# Patient Record
Sex: Female | Born: 1945 | Race: White | Hispanic: No | Marital: Married | Smoking: Never smoker
Health system: Southern US, Community
[De-identification: ages and names within clinical notes are randomized; demographics above are authoritative.]

## PROBLEM LIST (undated history)

## (undated) DIAGNOSIS — R011 Cardiac murmur, unspecified: Secondary | ICD-10-CM

## (undated) DIAGNOSIS — K219 Gastro-esophageal reflux disease without esophagitis: Secondary | ICD-10-CM

## (undated) DIAGNOSIS — Z8719 Personal history of other diseases of the digestive system: Secondary | ICD-10-CM

## (undated) DIAGNOSIS — E7800 Pure hypercholesterolemia, unspecified: Secondary | ICD-10-CM

## (undated) DIAGNOSIS — I10 Essential (primary) hypertension: Secondary | ICD-10-CM

## (undated) DIAGNOSIS — K589 Irritable bowel syndrome without diarrhea: Secondary | ICD-10-CM

## (undated) DIAGNOSIS — M1990 Unspecified osteoarthritis, unspecified site: Secondary | ICD-10-CM

## (undated) DIAGNOSIS — J45909 Unspecified asthma, uncomplicated: Secondary | ICD-10-CM

## (undated) DIAGNOSIS — H269 Unspecified cataract: Secondary | ICD-10-CM

## (undated) HISTORY — PX: DILATION AND CURETTAGE OF UTERUS: SHX78

## (undated) HISTORY — PX: COLONOSCOPY W/ BIOPSIES AND POLYPECTOMY: SHX1376

---
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
## 1984-02-25 HISTORY — PX: TUBAL LIGATION: SHX77

## 2003-02-25 HISTORY — PX: OTHER SURGICAL HISTORY: SHX169

## 2009-02-24 HISTORY — PX: REPLACEMENT TOTAL KNEE: SUR1224

## 2009-03-05 ENCOUNTER — Inpatient Hospital Stay (HOSPITAL_COMMUNITY): Admission: RE | Admit: 2009-03-05 | Discharge: 2009-03-07 | Payer: Self-pay | Admitting: Orthopedic Surgery

## 2010-05-12 LAB — CBC
HCT: 26.7 % — ABNORMAL LOW (ref 36.0–46.0)
HCT: 38 % (ref 36.0–46.0)
Hemoglobin: 10.2 g/dL — ABNORMAL LOW (ref 12.0–15.0)
Hemoglobin: 9.4 g/dL — ABNORMAL LOW (ref 12.0–15.0)
MCHC: 34.5 g/dL (ref 30.0–36.0)
MCHC: 34.8 g/dL (ref 30.0–36.0)
MCV: 89.8 fL (ref 78.0–100.0)
MCV: 90.2 fL (ref 78.0–100.0)
Platelets: 164 10*3/uL (ref 150–400)
Platelets: 177 10*3/uL (ref 150–400)
Platelets: 218 10*3/uL (ref 150–400)
RBC: 2.96 MIL/uL — ABNORMAL LOW (ref 3.87–5.11)
RBC: 3.27 MIL/uL — ABNORMAL LOW (ref 3.87–5.11)
RDW: 13 % (ref 11.5–15.5)

## 2010-05-12 LAB — COMPREHENSIVE METABOLIC PANEL
ALT: 17 U/L (ref 0–35)
Alkaline Phosphatase: 64 U/L (ref 39–117)
GFR calc Af Amer: >60 mL/min (ref >60)
GFR calc non Af Amer: >60 mL/min (ref >60)
Potassium: 4.4 mEq/L (ref 3.5–5.1)
Sodium: 140 mEq/L (ref 135–145)
Total Bilirubin: 1.1 mg/dL (ref 0.3–1.2)

## 2010-05-12 LAB — BASIC METABOLIC PANEL
BUN: 5 mg/dL — ABNORMAL LOW (ref 6–23)
Calcium: 8.4 mg/dL (ref 8.4–10.5)
Calcium: 8.6 mg/dL (ref 8.4–10.5)
Chloride: 99 mEq/L (ref 96–112)
Creatinine, Ser: 0.57 mg/dL (ref 0.4–1.2)
Creatinine, Ser: 0.57 mg/dL (ref 0.4–1.2)
GFR calc Af Amer: >60 mL/min (ref >60)
GFR calc non Af Amer: >60 mL/min (ref >60)
Potassium: 3.6 mEq/L (ref 3.5–5.1)

## 2010-05-12 LAB — URINE CULTURE

## 2010-05-12 LAB — DIFFERENTIAL
Basophils Absolute: 0 10*3/uL (ref 0.0–0.1)
Eosinophils Absolute: 0.1 10*3/uL (ref 0.0–0.7)
Lymphocytes Relative: 23 % (ref 12–46)
Lymphs Abs: 2.2 10*3/uL (ref 0.7–4.0)
Neutrophils Relative %: 67 % (ref 43–77)

## 2010-05-12 LAB — URINALYSIS, ROUTINE W REFLEX MICROSCOPIC
Protein, ur: NEGATIVE mg/dL
Specific Gravity, Urine: 1.013 (ref 1.005–1.030)
pH: 6 (ref 5.0–8.0)

## 2010-05-12 LAB — PROTIME-INR: INR: 0.98 (ref 0.00–1.49)

## 2010-05-12 LAB — APTT: aPTT: 28 seconds (ref 24–37)

## 2010-05-12 LAB — CROSSMATCH

## 2010-06-09 IMAGING — CR DG CHEST 2V
2 series · 2 of 2 positions shown · non-contrast
Comparison: None available.

CLINICAL DATA: Preadmission respiratory film for patient with knee
osteoarthritis.

CHEST - 2 VIEW

[view not recorded (1 of 2)]
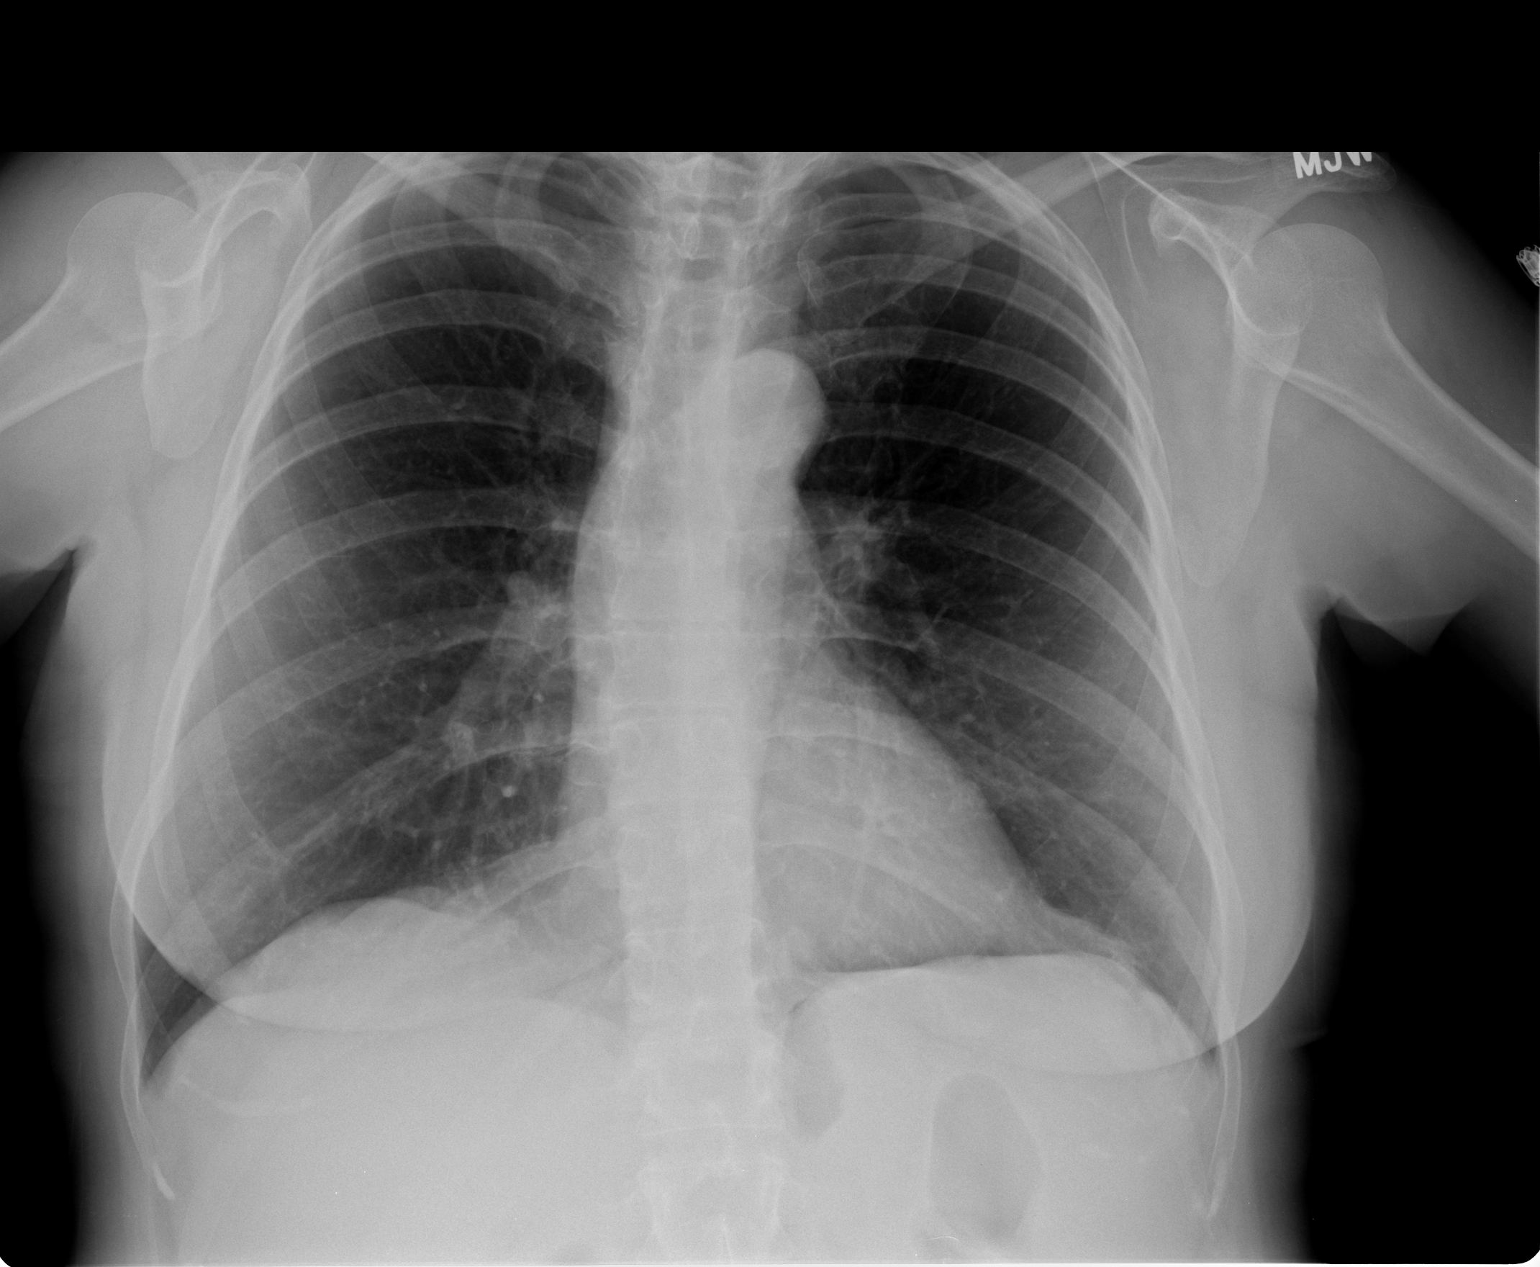

[view not recorded (2 of 2)]
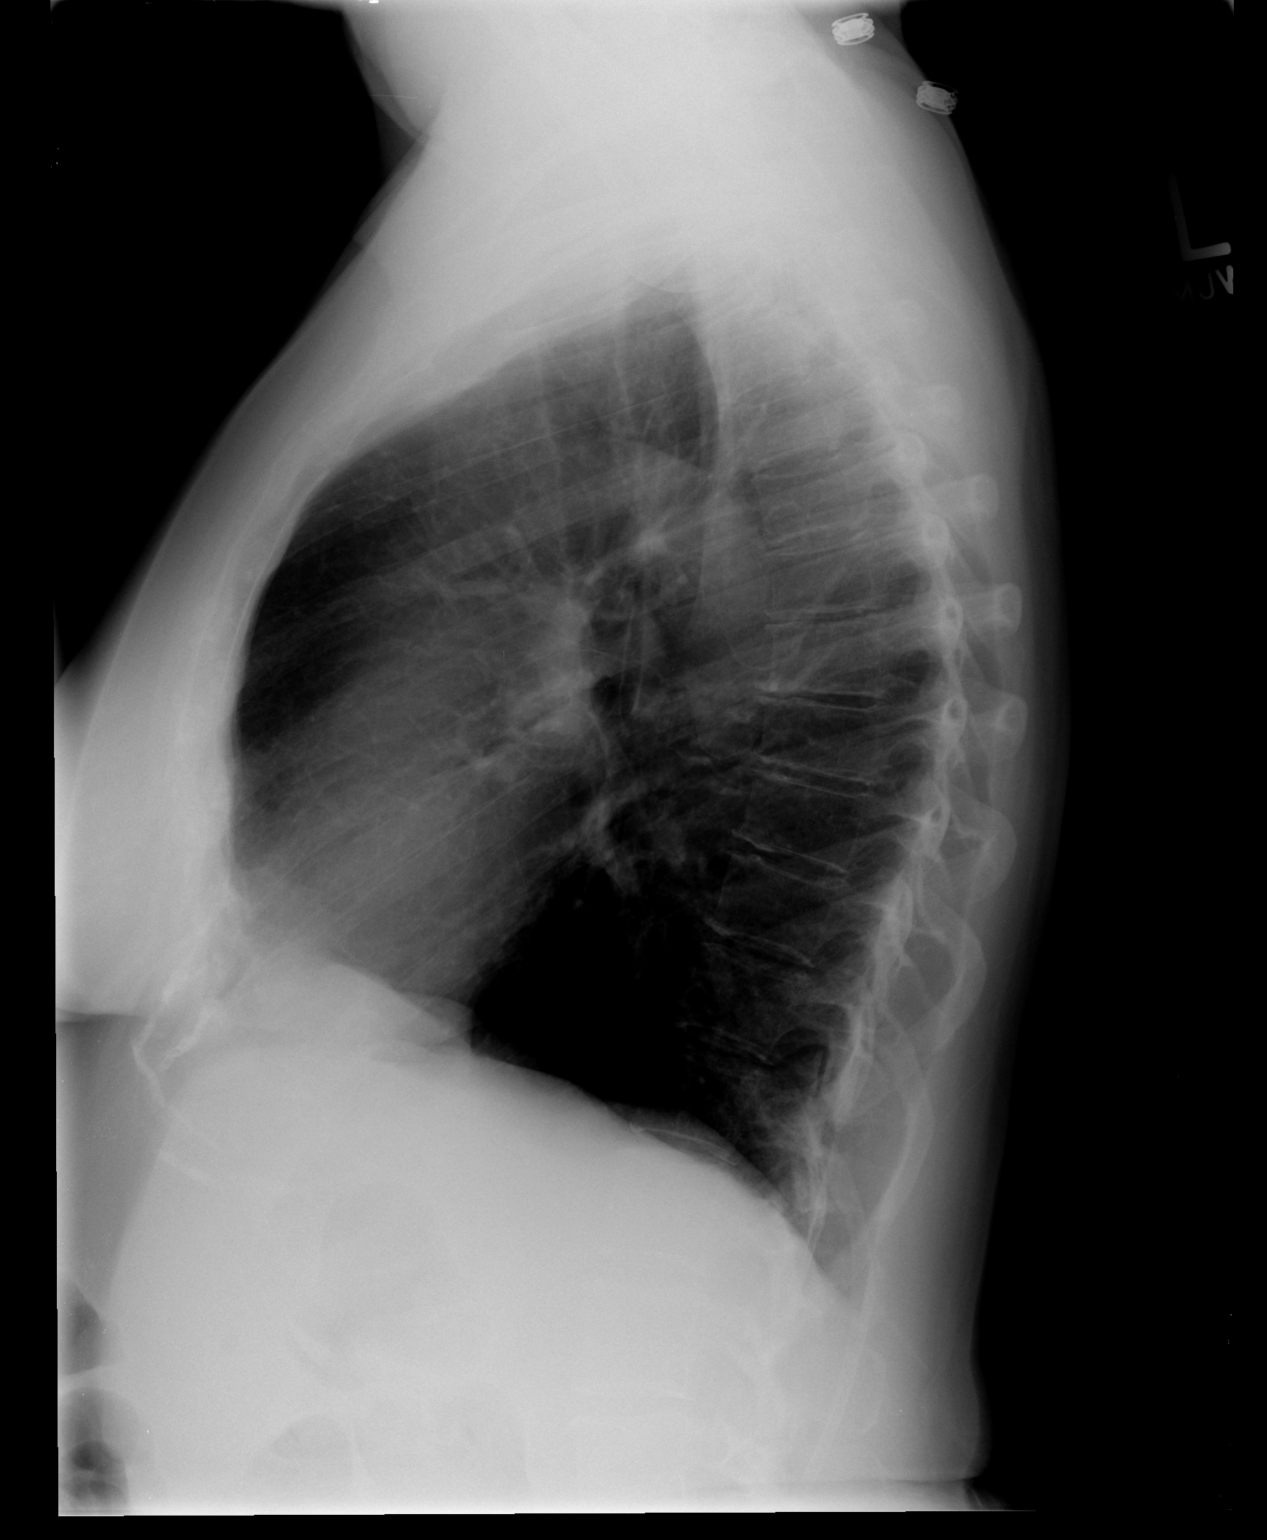

[2 of 2 positions shown; findings below may reference images not displayed]

FINDINGS: The lungs are clear.  Heart size is normal.  There is no
pleural effusion or focal bony abnormality.
IMPRESSION: No acute disease.

## 2013-08-30 ENCOUNTER — Ambulatory Visit (INDEPENDENT_AMBULATORY_CARE_PROVIDER_SITE_OTHER): Payer: Medicare Other | Admitting: General Surgery

## 2013-08-30 ENCOUNTER — Encounter (INDEPENDENT_AMBULATORY_CARE_PROVIDER_SITE_OTHER): Payer: Self-pay | Admitting: General Surgery

## 2013-08-30 VITALS — BP 122/85 | HR 95 | Temp 98.3°F | Resp 14 | Ht 63.0 in | Wt 168.6 lb

## 2013-08-30 DIAGNOSIS — N6091 Unspecified benign mammary dysplasia of right breast: Secondary | ICD-10-CM

## 2013-08-30 DIAGNOSIS — N62 Hypertrophy of breast: Secondary | ICD-10-CM

## 2013-08-30 NOTE — Patient Instructions (Signed)
[facility] °Office Phone Number [phone] ° °BREAST BIOPSY/ PARTIAL MASTECTOMY: POST OP INSTRUCTIONS ° °Always review your discharge instruction sheet given to you by the facility where your surgery was performed. ° °IF YOU HAVE DISABILITY OR FAMILY LEAVE FORMS, YOU MUST BRING THEM TO THE OFFICE FOR PROCESSING.  DO NOT GIVE THEM TO YOUR DOCTOR. ° °1. A prescription for pain medication may be given to you upon discharge.  Take your pain medication as prescribed, if needed.  If narcotic pain medicine is not needed, then you may take acetaminophen (Tylenol) or ibuprofen (Advil) as needed. °2. Take your usually prescribed medications unless otherwise directed °3. If you need a refill on your pain medication, please contact your pharmacy.  They will contact our office to request authorization.  Prescriptions will not be filled after 5pm or on week-ends. °4. You should eat very light the first 24 hours after surgery, such as soup, crackers, pudding, etc.  Resume your normal diet the day after surgery. °5. Most patients will experience some swelling and bruising in the breast.  Ice packs and a good support bra will help.  Swelling and bruising can take several days to resolve.  °6. It is common to experience some constipation if taking pain medication after surgery.  Increasing fluid intake and taking a stool softener will usually help or prevent this problem from occurring.  A mild laxative (Milk of Magnesia or Miralax) should be taken according to package directions if there are no bowel movements after 48 hours. °7. Unless discharge instructions indicate otherwise, you may remove your bandages 24-48 hours after surgery, and you may shower at that time.  You may have steri-strips (small skin tapes) in place directly over the incision.  These strips should be left on the skin for 7-10 days.  If your surgeon used skin glue on the incision, you may shower in 24 hours.  The glue will flake off over the  next 2-3 weeks.  Any sutures or staples will be removed at the office during your follow-up visit. °8. ACTIVITIES:  You may resume regular daily activities (gradually increasing) beginning the next day.  Wearing a good support bra or sports bra minimizes pain and swelling.  You may have sexual intercourse when it is comfortable. °a. You may drive when you no longer are taking prescription pain medication, you can comfortably wear a seatbelt, and you can safely maneuver your car and apply brakes. °b. RETURN TO WORK:  ______________________________________________________________________________________ °9. You should see your doctor in the office for a follow-up appointment approximately two weeks after your surgery.  Your doctor’s nurse will typically make your follow-up appointment when she calls you with your pathology report.  Expect your pathology report 2-3 business days after your surgery.  You may call to check if you do not hear from us after three days. °10. OTHER INSTRUCTIONS: _______________________________________________________________________________________________ _____________________________________________________________________________________________________________________________________ °_____________________________________________________________________________________________________________________________________ °_____________________________________________________________________________________________________________________________________ ° °WHEN TO CALL YOUR DOCTOR: °1. Fever over 101.0 °2. Nausea and/or vomiting. °3. Extreme swelling or bruising. °4. Continued bleeding from incision. °5. Increased pain, redness, or drainage from the incision. ° °The clinic staff is available to answer your questions during regular business hours.  Please don’t hesitate to call and ask to speak to one of the nurses for clinical concerns.  If you have a medical emergency, go to the nearest  emergency room or call 911.  A surgeon from [facility] is always on call at the hospital. ° °For further questions, please visit [email or web address]  °

## 2013-08-30 NOTE — Progress Notes (Signed)
Patient ID: Shannon Palmer, female   DOB: 10/02/1945, 68 y.o.   MRN: [id number]  No chief complaint on file.   HPI Shannon Palmer is a 68 y.o. female.   HPI  She is referred by Dr. Yolanda BonineBertrand because of atypical lobular hyperplasia of the right breast found on a biopsy of a area of microcalcifications in the right breast. She underwent a screening mammogram and was noted to have an area of microcalcifications at the 12:00 position of the right breast that was small. These were suspicious however. Image guided biopsy was performed.  The result is as above. She had a paternal aunt with breast cancer. Age at menarche was [age]. Menopause was in her 2840s. She's not had any children. No palpable masses. No nipple discharge. No female hormone replacement therapy.  HTN, Hyperlipidemia  Past Surgical History  Procedure Laterality Date  . Tonsillectomy  1949  . Wisdom tooth extraction  1964  . Tubal ligation  1986  . Replacement total knee Right 2011  . Knee arthroscopy Right 2005    Family History  Problem Relation Age of Onset  . Cancer Mother     leukemia  . Stroke Mother   . Cancer Father     colon  . Pneumonia Father     Social History History  Substance Use Topics  . Smoking status: Never Smoker   . Smokeless tobacco: Not on file  . Alcohol Use: 0.6 - 1.2 oz/week    1-2 Glasses of wine per week     Comment: 1-2 glasses daily    Allergies  Allergen Reactions  . Calencula Officinalis ([place]end[place]) Hives  . Ciprofloxacin Hives    Current Outpatient Prescriptions  Medication Sig Dispense Refill  . albuterol (PROVENTIL HFA;VENTOLIN HFA) 108 (90 BASE) MCG/ACT inhaler Inhale 1-2 puffs into the lungs every 6 (six) hours as needed for wheezing or shortness of breath.      Marland Kitchen. aspirin 81 MG tablet Take 81 mg by mouth daily.      . beta carotene w/minerals (OCUVITE) tablet Take 1 tablet by mouth daily.      . Cholecalciferol (VITAMIN D) 2000 UNITS tablet Take 2,000 Units by mouth daily.      Marland Kitchen.  co-enzyme Q-10 30 MG capsule Take 30 mg by mouth 3 (three) times daily.      . CYCLOBENZAPRINE HCL PO Take 10 mg by mouth 3 (three) times daily.      Marland Kitchen. ibuprofen (ADVIL,MOTRIN) 800 MG tablet Take 800 mg by mouth every 8 (eight) hours as needed.      Marland Kitchen. lisinopril-hydrochlorothiazide (PRINZIDE,ZESTORETIC) 20-12.5 MG per tablet Take 1 tablet by mouth daily.      . meclizine (ANTIVERT) 25 MG tablet Take 25 mg by mouth 3 (three) times daily as needed for dizziness.      . Omega-3 Fatty Acids (FISH OIL PO) Take by mouth daily.      . rosuvastatin (CRESTOR) 20 MG tablet Take 20 mg by mouth daily.       No current facility-administered medications for this visit.    Review of Systems Review of Systems  HENT: Negative.   Respiratory: Negative.   Cardiovascular: Negative.   Gastrointestinal: Negative.   Genitourinary: Negative.   Neurological: Negative.   Hematological: Negative.     Blood pressure 122/85, pulse 95, temperature 98.3 F (36.8 C), temperature source Temporal, resp. rate 14, height 5\' 3"  (1.6 m), weight 168 lb 9.6 oz (76.476 kg).  Physical Exam Physical Exam  Constitutional: She appears well-developed and well-nourished.  HENT:  Head: Normocephalic and atraumatic.  Eyes: No scleral icterus.  Neck: Neck supple.  Cardiovascular: Normal rate and regular rhythm.   Pulmonary/Chest: Effort normal and breath sounds normal.  Right breast demonstrates a puncture wound and some ecchymosis superiorly. No palpable mass. Left breast demonstrates no dominant masses or suspicious skin changes. No supraclavicular adenopathy.  Musculoskeletal:  No axillary adenopathy.  Lymphadenopathy:    She has no cervical adenopathy.  Neurological: She is alert.  Skin: Skin is warm and dry.  Psychiatric: She has a normal mood and affect. Her behavior is normal.    Data Reviewed Mammogram. Pathology report.  Assessment    Atypical lobular hyperplasia right breast which is a potentially  premalignant lesion. Wide excision of the area is recommended and I discussed this with her.     Plan    Right breast lumpectomy after wire localization.  I have explained the procedure, risks, and aftercare to her.  Risks include but are not limited to bleeding, infection, wound problems, deformity, anesthesia.  She seems to understand and agrees with the plan.        Charleen Madera J 08/30/2013, 5:33 PM    

## 2013-09-06 ENCOUNTER — Other Ambulatory Visit (INDEPENDENT_AMBULATORY_CARE_PROVIDER_SITE_OTHER): Payer: Self-pay | Admitting: General Surgery

## 2013-09-06 DIAGNOSIS — D0501 Lobular carcinoma in situ of right breast: Secondary | ICD-10-CM

## 2013-09-07 ENCOUNTER — Encounter (HOSPITAL_COMMUNITY): Payer: Self-pay | Admitting: Pharmacy Technician

## 2013-09-12 ENCOUNTER — Encounter (HOSPITAL_COMMUNITY): Payer: Self-pay

## 2013-09-12 ENCOUNTER — Ambulatory Visit (HOSPITAL_COMMUNITY)
Admission: RE | Admit: 2013-09-12 | Discharge: 2013-09-12 | Disposition: A | Discharge status: Home / Self Care | Payer: Medicare Other | Source: Ambulatory Visit | Attending: General Surgery | Admitting: General Surgery

## 2013-09-12 ENCOUNTER — Ambulatory Visit (HOSPITAL_COMMUNITY)
Admission: RE | Admit: 2013-09-12 | Discharge: 2013-09-12 | Disposition: A | Discharge status: Home / Self Care | Payer: Medicare Other | Source: Ambulatory Visit | Attending: Anesthesiology | Admitting: Anesthesiology

## 2013-09-12 DIAGNOSIS — E785 Hyperlipidemia, unspecified: Secondary | ICD-10-CM | POA: Diagnosis not present

## 2013-09-12 DIAGNOSIS — Z96659 Presence of unspecified artificial knee joint: Secondary | ICD-10-CM | POA: Diagnosis not present

## 2013-09-12 DIAGNOSIS — N6019 Diffuse cystic mastopathy of unspecified breast: Secondary | ICD-10-CM | POA: Diagnosis present

## 2013-09-12 DIAGNOSIS — Z79899 Other long term (current) drug therapy: Secondary | ICD-10-CM | POA: Diagnosis not present

## 2013-09-12 DIAGNOSIS — Z7982 Long term (current) use of aspirin: Secondary | ICD-10-CM | POA: Diagnosis not present

## 2013-09-12 DIAGNOSIS — I10 Essential (primary) hypertension: Secondary | ICD-10-CM | POA: Diagnosis not present

## 2013-09-12 DIAGNOSIS — K449 Diaphragmatic hernia without obstruction or gangrene: Secondary | ICD-10-CM | POA: Diagnosis not present

## 2013-09-12 DIAGNOSIS — K219 Gastro-esophageal reflux disease without esophagitis: Secondary | ICD-10-CM | POA: Diagnosis not present

## 2013-09-12 DIAGNOSIS — J45909 Unspecified asthma, uncomplicated: Secondary | ICD-10-CM | POA: Diagnosis not present

## 2013-09-12 HISTORY — DX: Irritable bowel syndrome, unspecified: K58.9

## 2013-09-12 HISTORY — DX: Cardiac murmur, unspecified: R01.1

## 2013-09-12 HISTORY — DX: Pure hypercholesterolemia, unspecified: E78.00

## 2013-09-12 HISTORY — DX: Unspecified cataract: H26.9

## 2013-09-12 HISTORY — DX: Gastro-esophageal reflux disease without esophagitis: K21.9

## 2013-09-12 HISTORY — DX: Unspecified asthma, uncomplicated: J45.909

## 2013-09-12 HISTORY — DX: Essential (primary) hypertension: I10

## 2013-09-12 HISTORY — DX: Personal history of other diseases of the digestive system: Z87.19

## 2013-09-12 HISTORY — DX: Unspecified osteoarthritis, unspecified site: M19.90

## 2013-09-12 LAB — CBC WITH DIFFERENTIAL/PLATELET
Basophils Absolute: 0 10*3/uL (ref 0.0–0.1)
Basophils Relative: 0 % (ref 0–1)
EOS ABS: 0.1 10*3/uL (ref 0.0–0.7)
EOS PCT: 1 % (ref 0–5)
HCT: 39 % (ref 36.0–46.0)
Hemoglobin: 13.1 g/dL (ref 12.0–15.0)
Lymphocytes Relative: 27 % (ref 12–46)
Lymphs Abs: 2.9 10*3/uL (ref 0.7–4.0)
MCH: 30.3 pg (ref 26.0–34.0)
MCHC: 33.6 g/dL (ref 30.0–36.0)
MCV: 90.1 fL (ref 78.0–100.0)
Monocytes Absolute: 0.8 10*3/uL (ref 0.1–1.0)
Monocytes Relative: 7 % (ref 3–12)
Neutro Abs: 6.8 10*3/uL (ref 1.7–7.7)
Neutrophils Relative %: 65 % (ref 43–77)
PLATELETS: 216 10*3/uL (ref 150–400)
RBC: 4.33 MIL/uL (ref 3.87–5.11)
RDW: 12.7 % (ref 11.5–15.5)
WBC: 10.5 10*3/uL (ref 4.0–10.5)

## 2013-09-12 LAB — COMPREHENSIVE METABOLIC PANEL
ALT: 14 U/L (ref 0–35)
ANION GAP: 17 — AB (ref 5–15)
AST: 16 U/L (ref 0–37)
Albumin: 4.2 g/dL (ref 3.5–5.2)
Alkaline Phosphatase: 65 U/L (ref 39–117)
BUN: 22 mg/dL (ref 6–23)
CALCIUM: 9.7 mg/dL (ref 8.4–10.5)
CO2: 24 mEq/L (ref 19–32)
Chloride: 98 mEq/L (ref 96–112)
Creatinine, Ser: 0.72 mg/dL (ref 0.50–1.10)
GFR calc Af Amer: >90 mL/min (ref >90)
GFR calc non Af Amer: 86 mL/min — ABNORMAL LOW (ref >90)
Glucose, Bld: 99 mg/dL (ref 70–99)
Potassium: 3.9 mEq/L (ref 3.7–5.3)
SODIUM: 139 meq/L (ref 137–147)
TOTAL PROTEIN: 7.5 g/dL (ref 6.0–8.3)
Total Bilirubin: 0.7 mg/dL (ref 0.3–1.2)

## 2013-09-12 LAB — PROTIME-INR
INR: 0.95 (ref 0.00–1.49)
Prothrombin Time: 12.7 seconds (ref 11.6–15.2)

## 2013-09-12 MED ORDER — CEFAZOLIN SODIUM-DEXTROSE 2-3 GM-% IV SOLR
2.0000 g | INTRAVENOUS | Status: AC
  Administered 2013-09-13: 2 g via INTRAVENOUS
  Filled 2013-09-12: qty 50

## 2013-09-12 NOTE — Pre-Procedure Instructions (Signed)
Shannon Palmer  09/12/2013   Your procedure is scheduled on: Tuesday, September 13, 2013  [facility] at 10:30 AM.  Call this number if you have problems the morning of surgery: [phone]   Remember:   Do not eat food or drink liquids after midnight tonight.   Take these medicines the morning of surgery with A SIP OF WATER: None . If needed: Albuterol (PROVENTIL) inhaler for wheezing or shortness of breath.( Bring inhaler with you on day of surgery). Stop taking Aspirin, vitamins, and herbal medications ( Fish oil, CO Q 10, OCUVITE)    Do not take any NSAIDs ie: Ibuprofen, Advil, Naproxen or any medication containing Aspirin;stop now.   Do not wear jewelry, make-up or nail polish.  Do not wear lotions, powders, or perfumes. You may NOT wear deodorant.  Do not shave 48 hours prior to surgery.  Do not bring valuables to the hospital.  [facility] is not responsible  for any belongings or valuables.               Contacts, dentures or bridgework may not be worn into surgery.  Leave suitcase in the car. After surgery it may be brought to your room.  For patients admitted to the hospital, discharge time is determined by your treatment team.               Patients discharged the day of surgery will not be allowed to drive home.  Name and phone number of your driver:   Special Instructions:  Special Instructions:Special Instructions: [facility] - Pre[facility]  Before surgery, you can play an important role.  Because skin is not sterile, your skin needs to be as free of germs as possible.  You can reduce the number of germs on you skin by washing with CHG (chlorahexidine gluconate) soap before surgery.  CHG is an antiseptic cleaner which kills germs and bonds with the skin to continue killing germs even after washing.  Please DO NOT use if you have an allergy to CHG or antibacterial soaps.  If your skin becomes reddened/irritated stop using the CHG and  inform your nurse when you arrive at Short Stay.  Do not shave (including legs and underarms) for at least 48 hours prior to the first CHG shower.  You may shave your face.  Please follow these instructions carefully:   1.  Shower with CHG Soap the night before surgery and the morning of Surgery.  2.  If you choose to wash your hair, wash your hair first as usual with your normal shampoo.  3.  After you shampoo, rinse your hair and body thoroughly to remove the Shampoo.  4.  Use CHG as you would any other liquid soap.  You can apply chg directly  to the skin and wash gently with scrungie or a clean washcloth.  5.  Apply the CHG Soap to your body ONLY FROM THE NECK DOWN.  Do not use on open wounds or open sores.  Avoid contact with your eyes, ears, mouth and genitals (private parts).  Wash genitals (private parts) with your normal soap.  6.  Wash thoroughly, paying special attention to the area where your surgery will be performed.  7.  Thoroughly rinse your body with warm water from the neck down.  8.  DO NOT shower/wash with your normal soap after using and rinsing off the CHG Soap.  9.  Pat yourself dry with a clean towel.  10.  Wear clean pajamas.            11.  Place clean sheets on your bed the night of your first shower and do not sleep with pets.  Day of Surgery  Do not apply any lotions/deodorants the morning of surgery.  Please wear clean clothes to the hospital/surgery center.   Please read over the following fact sheets that you were given: Pain Booklet, Coughing and Deep Breathing and Surgical Site Infection Prevention

## 2013-09-12 NOTE — Progress Notes (Signed)
EKG shown to Dr. Randa EvensEdwards.( anesthesia). MD advised to " evaluate on arrival."

## 2013-09-13 ENCOUNTER — Encounter (HOSPITAL_COMMUNITY)
Admission: RE | Disposition: A | Discharge status: Home / Self Care | Payer: Self-pay | Source: Ambulatory Visit | Attending: General Surgery

## 2013-09-13 ENCOUNTER — Ambulatory Visit (HOSPITAL_COMMUNITY): Payer: Medicare Other | Admitting: Certified Registered"

## 2013-09-13 ENCOUNTER — Ambulatory Visit (HOSPITAL_COMMUNITY)
Admission: RE | Admit: 2013-09-13 | Discharge: 2013-09-13 | Disposition: A | Discharge status: Home / Self Care | Payer: Medicare Other | Source: Ambulatory Visit | Attending: General Surgery | Admitting: General Surgery

## 2013-09-13 ENCOUNTER — Encounter (HOSPITAL_COMMUNITY): Payer: Medicare Other | Admitting: Certified Registered"

## 2013-09-13 ENCOUNTER — Encounter (HOSPITAL_COMMUNITY): Payer: Self-pay | Admitting: *Deleted

## 2013-09-13 DIAGNOSIS — Z79899 Other long term (current) drug therapy: Secondary | ICD-10-CM | POA: Insufficient documentation

## 2013-09-13 DIAGNOSIS — Z7982 Long term (current) use of aspirin: Secondary | ICD-10-CM | POA: Insufficient documentation

## 2013-09-13 DIAGNOSIS — E785 Hyperlipidemia, unspecified: Secondary | ICD-10-CM | POA: Insufficient documentation

## 2013-09-13 DIAGNOSIS — I10 Essential (primary) hypertension: Secondary | ICD-10-CM | POA: Diagnosis not present

## 2013-09-13 DIAGNOSIS — N6029 Fibroadenosis of unspecified breast: Secondary | ICD-10-CM

## 2013-09-13 DIAGNOSIS — Z96659 Presence of unspecified artificial knee joint: Secondary | ICD-10-CM | POA: Insufficient documentation

## 2013-09-13 DIAGNOSIS — J45909 Unspecified asthma, uncomplicated: Secondary | ICD-10-CM | POA: Insufficient documentation

## 2013-09-13 DIAGNOSIS — N6019 Diffuse cystic mastopathy of unspecified breast: Secondary | ICD-10-CM | POA: Insufficient documentation

## 2013-09-13 DIAGNOSIS — K449 Diaphragmatic hernia without obstruction or gangrene: Secondary | ICD-10-CM | POA: Diagnosis not present

## 2013-09-13 DIAGNOSIS — R920 Mammographic microcalcification found on diagnostic imaging of breast: Secondary | ICD-10-CM

## 2013-09-13 DIAGNOSIS — K219 Gastro-esophageal reflux disease without esophagitis: Secondary | ICD-10-CM | POA: Insufficient documentation

## 2013-09-13 HISTORY — PX: BREAST LUMPECTOMY WITH NEEDLE LOCALIZATION: SHX5759

## 2013-09-13 SURGERY — BREAST LUMPECTOMY WITH NEEDLE LOCALIZATION
Anesthesia: General | Site: Breast | Laterality: Right

## 2013-09-13 MED ORDER — ACETAMINOPHEN 650 MG RE SUPP
650.0000 mg | RECTAL | Status: DC | PRN
  Filled 2013-09-13: qty 1

## 2013-09-13 MED ORDER — ONDANSETRON HCL 4 MG/2ML IJ SOLN
INTRAMUSCULAR | Status: DC | PRN
  Administered 2013-09-13: 4 mg via INTRAVENOUS

## 2013-09-13 MED ORDER — HYDROCODONE-ACETAMINOPHEN 5-325 MG PO TABS
1.0000 | ORAL_TABLET | ORAL | Status: DC | PRN
  Administered 2013-09-13: 1 via ORAL

## 2013-09-13 MED ORDER — 0.9 % SODIUM CHLORIDE (POUR BTL) OPTIME
TOPICAL | Status: DC | PRN
  Administered 2013-09-13: 1000 mL

## 2013-09-13 MED ORDER — BUPIVACAINE HCL (PF) 0.25 % IJ SOLN
INTRAMUSCULAR | Status: AC
  Filled 2013-09-13: qty 30

## 2013-09-13 MED ORDER — PROPOFOL 10 MG/ML IV BOLUS
INTRAVENOUS | Status: AC
  Filled 2013-09-13: qty 20

## 2013-09-13 MED ORDER — SUCCINYLCHOLINE CHLORIDE 20 MG/ML IJ SOLN
INTRAMUSCULAR | Status: AC
  Filled 2013-09-13: qty 1

## 2013-09-13 MED ORDER — LACTATED RINGERS IV SOLN
INTRAVENOUS | Status: DC
  Administered 2013-09-13: 12:00:00 via INTRAVENOUS

## 2013-09-13 MED ORDER — PROPOFOL 10 MG/ML IV BOLUS
INTRAVENOUS | Status: DC | PRN
  Administered 2013-09-13: 200 mg via INTRAVENOUS

## 2013-09-13 MED ORDER — FENTANYL CITRATE 0.05 MG/ML IJ SOLN
INTRAMUSCULAR | Status: DC | PRN
  Administered 2013-09-13 (×4): 50 ug via INTRAVENOUS

## 2013-09-13 MED ORDER — LACTATED RINGERS IV SOLN
INTRAVENOUS | Status: DC | PRN
  Administered 2013-09-13: 12:00:00 via INTRAVENOUS

## 2013-09-13 MED ORDER — LIDOCAINE HCL (CARDIAC) 20 MG/ML IV SOLN
INTRAVENOUS | Status: DC | PRN
  Administered 2013-09-13: 60 mg via INTRAVENOUS

## 2013-09-13 MED ORDER — BUPIVACAINE HCL (PF) 0.25 % IJ SOLN
INTRAMUSCULAR | Status: DC | PRN
  Administered 2013-09-13: 17 mL

## 2013-09-13 MED ORDER — ROCURONIUM BROMIDE 50 MG/5ML IV SOLN
INTRAVENOUS | Status: AC
  Filled 2013-09-13: qty 1

## 2013-09-13 MED ORDER — MORPHINE SULFATE 2 MG/ML IJ SOLN: 2.0000 mg | INTRAMUSCULAR | Status: DC | PRN

## 2013-09-13 MED ORDER — HYDROCODONE-ACETAMINOPHEN 5-325 MG PO TABS
ORAL_TABLET | ORAL | Status: AC
  Filled 2013-09-13: qty 1

## 2013-09-13 MED ORDER — HYDROCODONE-ACETAMINOPHEN 5-325 MG PO TABS: 1.0000 | ORAL_TABLET | ORAL | Status: DC | PRN

## 2013-09-13 MED ORDER — FENTANYL CITRATE 0.05 MG/ML IJ SOLN
INTRAMUSCULAR | Status: AC
  Filled 2013-09-13: qty 5

## 2013-09-13 MED ORDER — PHENYLEPHRINE HCL 10 MG/ML IJ SOLN
INTRAMUSCULAR | Status: DC | PRN
  Administered 2013-09-13 (×5): 40 ug via INTRAVENOUS
  Administered 2013-09-13: 80 ug via INTRAVENOUS

## 2013-09-13 MED ORDER — SODIUM CHLORIDE 0.9 % IJ SOLN: 3.0000 mL | INTRAMUSCULAR | Status: DC | PRN

## 2013-09-13 MED ORDER — PHENYLEPHRINE 40 MCG/ML (10ML) SYRINGE FOR IV PUSH (FOR BLOOD PRESSURE SUPPORT)
PREFILLED_SYRINGE | INTRAVENOUS | Status: AC
  Filled 2013-09-13: qty 10

## 2013-09-13 MED ORDER — ACETAMINOPHEN 325 MG PO TABS
650.0000 mg | ORAL_TABLET | ORAL | Status: DC | PRN
  Filled 2013-09-13: qty 2

## 2013-09-13 SURGICAL SUPPLY — 49 items
BENZOIN TINCTURE PRP APPL 2/3 (GAUZE/BANDAGES/DRESSINGS) ×3 IMPLANT
BINDER BREAST LRG (GAUZE/BANDAGES/DRESSINGS) IMPLANT
BINDER BREAST XLRG (GAUZE/BANDAGES/DRESSINGS) ×3 IMPLANT
CANISTER SUCTION 2500CC (MISCELLANEOUS) ×3 IMPLANT
CHLORAPREP W/TINT 26ML (MISCELLANEOUS) ×3 IMPLANT
CLOSURE WOUND 1/2 X4 (GAUZE/BANDAGES/DRESSINGS) ×1
COVER SURGICAL LIGHT HANDLE (MISCELLANEOUS) ×3 IMPLANT
DECANTER SPIKE VIAL GLASS SM (MISCELLANEOUS) IMPLANT
DEVICE DUBIN SPECIMEN MAMMOGRA (MISCELLANEOUS) ×3 IMPLANT
DRAPE CHEST BREAST 15X10 FENES (DRAPES) ×3 IMPLANT
DRAPE UTILITY 15X26 W/TAPE STR (DRAPE) ×6 IMPLANT
DRSG OPSITE 4X5.5 SM (GAUZE/BANDAGES/DRESSINGS) IMPLANT
ELECT CAUTERY BLADE 6.4 (BLADE) ×3 IMPLANT
ELECT REM PT RETURN 9FT ADLT (ELECTROSURGICAL) ×3
ELECTRODE REM PT RTRN 9FT ADLT (ELECTROSURGICAL) ×1 IMPLANT
GLOVE BIO SURGEON STRL SZ7.5 (GLOVE) ×3 IMPLANT
GLOVE BIOGEL PI IND STRL 6.5 (GLOVE) ×1 IMPLANT
GLOVE BIOGEL PI IND STRL 7.0 (GLOVE) ×1 IMPLANT
GLOVE BIOGEL PI IND STRL 7.5 (GLOVE) ×1 IMPLANT
GLOVE BIOGEL PI IND STRL 8 (GLOVE) ×1 IMPLANT
GLOVE BIOGEL PI INDICATOR 6.5 (GLOVE) ×2
GLOVE BIOGEL PI INDICATOR 7.0 (GLOVE) ×2
GLOVE BIOGEL PI INDICATOR 7.5 (GLOVE) ×2
GLOVE BIOGEL PI INDICATOR 8 (GLOVE) ×2
GLOVE ECLIPSE 7.0 STRL STRAW (GLOVE) ×3 IMPLANT
GLOVE ECLIPSE 8.0 STRL XLNG CF (GLOVE) ×3 IMPLANT
GOWN STRL REUS W/ TWL LRG LVL3 (GOWN DISPOSABLE) ×3 IMPLANT
GOWN STRL REUS W/TWL LRG LVL3 (GOWN DISPOSABLE) ×6
KIT BASIN OR (CUSTOM PROCEDURE TRAY) ×3 IMPLANT
KIT ROOM TURNOVER OR (KITS) ×3 IMPLANT
NEEDLE HYPO 25GX1X1/2 BEV (NEEDLE) ×3 IMPLANT
NS IRRIG 1000ML POUR BTL (IV SOLUTION) ×3 IMPLANT
PACK SURGICAL SETUP 50X90 (CUSTOM PROCEDURE TRAY) ×3 IMPLANT
PAD ARMBOARD 7.5X6 YLW CONV (MISCELLANEOUS) ×3 IMPLANT
PENCIL BUTTON HOLSTER BLD 10FT (ELECTRODE) ×3 IMPLANT
SPONGE GAUZE 4X4 12PLY (GAUZE/BANDAGES/DRESSINGS) ×3 IMPLANT
SPONGE LAP 4X18 X RAY DECT (DISPOSABLE) ×3 IMPLANT
STRIP CLOSURE SKIN 1/2X4 (GAUZE/BANDAGES/DRESSINGS) ×2 IMPLANT
SUT MNCRL AB 4-0 PS2 18 (SUTURE) ×3 IMPLANT
SUT SILK 3 0 SH 30 (SUTURE) ×3 IMPLANT
SUT VIC AB 3-0 SH 18 (SUTURE) ×3 IMPLANT
SYR BULB 3OZ (MISCELLANEOUS) ×3 IMPLANT
SYR CONTROL 10ML LL (SYRINGE) ×3 IMPLANT
TAPE CLOTH SURG 6X10 WHT LF (GAUZE/BANDAGES/DRESSINGS) ×3 IMPLANT
TOWEL OR 17X24 6PK STRL BLUE (TOWEL DISPOSABLE) IMPLANT
TOWEL OR 17X26 10 PK STRL BLUE (TOWEL DISPOSABLE) ×3 IMPLANT
TUBE CONNECTING 12'X1/4 (SUCTIONS) ×1
TUBE CONNECTING 12X1/4 (SUCTIONS) ×2 IMPLANT
YANKAUER SUCT BULB TIP NO VENT (SUCTIONS) ×3 IMPLANT

## 2013-09-13 NOTE — Op Note (Signed)
Operative Note  Shannon Palmer female 68 y.o. 09/13/2013  PREOPERATIVE DX:  Atypical lobular hyperplasia of right breast  POSTOPERATIVE DX:  Same  PROCEDURE:  Right breast lumpectomy after wire localization         Surgeon: Adolph PollackOSENBOWER,Maridee Slape J   Assistants: none  Anesthesia: General LMA anesthesia  Indications:  This is a 68 year old female who was noted to have a suspicious lesion at the 12:00 position the right breast on mammogram. Image guided biopsy was consistent with atypical lobular hyperplasia. She now presents for wider excision of that area/lumpectomy after wire localization.    Procedure Detail:  She underwent successful wire localization at [facility].  She was seen in the holding room in the right breast mark with my initials. She is brought to the operating room placed supine on the operating table Gen. Anesthetic was given. The bandage on the right breast was removed and the wire was cut close to the skin. The breast and wire were sterilely prepped and draped.  In the superior aspect of the right breast a curvilinear incision was made through the skin and subcutaneous tissue. The wire was then delivered into the wound. Using electrocautery a lumpectomy was performed around the wire and tip of the wire. Adequate gross margins were estimated. Once the specimen was removed, it was oriented with silk sutures.  The specimen mammogram was performed and demonstrated the area of concern in the middle of the specimen. This was confirmed by the radiologist as well. The specimen was then sent to pathology.  The Marcaine solution was infiltrated into the wound for local anesthetic effect. Hemostasis was controlled with electrocautery. Once hemostasis was adequate, the subcutaneous tissues were closed in layers with interrupted 3-0 Vicryl suture. Skin was approximated a running 4 Monocryl subcuticular stitch. Steri-Strips and sterile dressings were applied.  She tolerated the  procedure well without any apparent complications and was taken to the recovery room in satisfactory condition.  Estimated Blood Loss:  less than 100 mL         Drains: none       Blood Given: none          Specimens: right breast tissue        Complications:  * No complications entered in OR log *         Disposition: PACU - hemodynamically stable.         Condition: stable

## 2013-09-13 NOTE — Interval H&P Note (Signed)
History and Physical Interval Note:  09/13/2013 12:39 PM  Shannon Palmer  has presented today for surgery, with the diagnosis of ATYPICAL LOBULAR HYPERPLASIA RIGHT BREAST  The various methods of treatment have been discussed with the patient and family. After consideration of risks, benefits and other options for treatment, the patient has consented to  Procedure(s): BREAST LUMPECTOMY WITH NEEDLE LOCALIZATION (Right) as a surgical intervention .  The patient's history has been reviewed, patient examined, no change in status, stable for surgery.  I have reviewed the patient's chart and labs.  Questions were answered to the patient's satisfaction.     Katey Barrie JShela Commons

## 2013-09-13 NOTE — Transfer of Care (Signed)
Immediate Anesthesia Transfer of Care Note  Patient: Shannon Palmer  Procedure(s) Performed: Procedure(s): BREAST LUMPECTOMY WITH NEEDLE LOCALIZATION (Right)  Patient Location: PACU  Anesthesia Type:General  Level of Consciousness: awake, alert  and oriented  Airway & Oxygen Therapy: Patient Spontanous Breathing  Post-op Assessment: Report given to PACU RN  Post vital signs: stable  Complications: No apparent anesthesia complications

## 2013-09-13 NOTE — H&P (View-Only) (Signed)
Patient ID: Shannon Palmer, female   DOB: 10/02/1945, 68 y.o.   MRN: [id number]  No chief complaint on file.   HPI Shannon Palmer is a 68 y.o. female.   HPI  She is referred by Dr. Yolanda BonineBertrand because of atypical lobular hyperplasia of the right breast found on a biopsy of a area of microcalcifications in the right breast. She underwent a screening mammogram and was noted to have an area of microcalcifications at the 12:00 position of the right breast that was small. These were suspicious however. Image guided biopsy was performed.  The result is as above. She had a paternal aunt with breast cancer. Age at menarche was [age]. Menopause was in her 2840s. She's not had any children. No palpable masses. No nipple discharge. No female hormone replacement therapy.  HTN, Hyperlipidemia  Past Surgical History  Procedure Laterality Date  . Tonsillectomy  1949  . Wisdom tooth extraction  1964  . Tubal ligation  1986  . Replacement total knee Right 2011  . Knee arthroscopy Right 2005    Family History  Problem Relation Age of Onset  . Cancer Mother     leukemia  . Stroke Mother   . Cancer Father     colon  . Pneumonia Father     Social History History  Substance Use Topics  . Smoking status: Never Smoker   . Smokeless tobacco: Not on file  . Alcohol Use: 0.6 - 1.2 oz/week    1-2 Glasses of wine per week     Comment: 1-2 glasses daily    Allergies  Allergen Reactions  . Calencula Officinalis ([place]) Hives  . Ciprofloxacin Hives    Current Outpatient Prescriptions  Medication Sig Dispense Refill  . albuterol (PROVENTIL HFA;VENTOLIN HFA) 108 (90 BASE) MCG/ACT inhaler Inhale 1-2 puffs into the lungs every 6 (six) hours as needed for wheezing or shortness of breath.      Marland Kitchen. aspirin 81 MG tablet Take 81 mg by mouth daily.      . beta carotene w/minerals (OCUVITE) tablet Take 1 tablet by mouth daily.      . Cholecalciferol (VITAMIN D) 2000 UNITS tablet Take 2,000 Units by mouth daily.      Marland Kitchen.  co-enzyme Q-10 30 MG capsule Take 30 mg by mouth 3 (three) times daily.      . CYCLOBENZAPRINE HCL PO Take 10 mg by mouth 3 (three) times daily.      Marland Kitchen. ibuprofen (ADVIL,MOTRIN) 800 MG tablet Take 800 mg by mouth every 8 (eight) hours as needed.      Marland Kitchen. lisinopril-hydrochlorothiazide (PRINZIDE,ZESTORETIC) 20-12.5 MG per tablet Take 1 tablet by mouth daily.      . meclizine (ANTIVERT) 25 MG tablet Take 25 mg by mouth 3 (three) times daily as needed for dizziness.      . Omega-3 Fatty Acids (FISH OIL PO) Take by mouth daily.      . rosuvastatin (CRESTOR) 20 MG tablet Take 20 mg by mouth daily.       No current facility-administered medications for this visit.    Review of Systems Review of Systems  HENT: Negative.   Respiratory: Negative.   Cardiovascular: Negative.   Gastrointestinal: Negative.   Genitourinary: Negative.   Neurological: Negative.   Hematological: Negative.     Blood pressure 122/85, pulse 95, temperature 98.3 F (36.8 C), temperature source Temporal, resp. rate 14, height 5\' 3"  (1.6 m), weight 168 lb 9.6 oz (76.476 kg).  Physical Exam Physical Exam  Constitutional: She appears well-developed and well-nourished.  HENT:  Head: Normocephalic and atraumatic.  Eyes: No scleral icterus.  Neck: Neck supple.  Cardiovascular: Normal rate and regular rhythm.   Pulmonary/Chest: Effort normal and breath sounds normal.  Right breast demonstrates a puncture wound and some ecchymosis superiorly. No palpable mass. Left breast demonstrates no dominant masses or suspicious skin changes. No supraclavicular adenopathy.  Musculoskeletal:  No axillary adenopathy.  Lymphadenopathy:    She has no cervical adenopathy.  Neurological: She is alert.  Skin: Skin is warm and dry.  Psychiatric: She has a normal mood and affect. Her behavior is normal.    Data Reviewed Mammogram. Pathology report.  Assessment    Atypical lobular hyperplasia right breast which is a potentially  premalignant lesion. Wide excision of the area is recommended and I discussed this with her.     Plan    Right breast lumpectomy after wire localization.  I have explained the procedure, risks, and aftercare to her.  Risks include but are not limited to bleeding, infection, wound problems, deformity, anesthesia.  She seems to understand and agrees with the plan.        Jerad Dunlap J 08/30/2013, 5:33 PM

## 2013-09-13 NOTE — Anesthesia Preprocedure Evaluation (Addendum)
Anesthesia Evaluation  Patient identified by MRN, date of birth, ID band Patient awake    Reviewed: Allergy & Precautions, H&P , NPO status , Patient's Chart, lab work & pertinent test results, reviewed documented beta blocker date and time   Airway Mallampati: I TM Distance: >3 FB     Dental  (+) Teeth Intact, Dental Advisory Given   Pulmonary asthma ,  breath sounds clear to auscultation        Cardiovascular hypertension, Pt. on medications Rhythm:Regular     Neuro/Psych    GI/Hepatic hiatal hernia, GERD-  Medicated and Controlled,  Endo/Other    Renal/GU      Musculoskeletal   Abdominal (+)  Abdomen: soft. Bowel sounds: normal.  Peds  Hematology   Anesthesia Other Findings   Reproductive/Obstetrics                       Anesthesia Physical Anesthesia Plan  ASA: II  Anesthesia Plan: General   Post-op Pain Management:    Induction:   Airway Management Planned: LMA  Additional Equipment:   Intra-op Plan:   Post-operative Plan:   Informed Consent:   Plan Discussed with:   Anesthesia Plan Comments:         Anesthesia Quick Evaluation

## 2013-09-13 NOTE — Discharge Instructions (Signed)
[facility] Office Phone Number [phone]  BREAST BIOPSY/ PARTIAL MASTECTOMY: POST OP INSTRUCTIONS  Always review your discharge instruction sheet given to you by the facility where your surgery was performed.  IF YOU HAVE DISABILITY OR FAMILY LEAVE FORMS, YOU MUST BRING THEM TO THE OFFICE FOR PROCESSING.  DO NOT GIVE THEM TO YOUR DOCTOR.  1. A prescription for pain medication may be given to you upon discharge.  Take your pain medication as prescribed, if needed.  If narcotic pain medicine is not needed, then you may take acetaminophen (Tylenol) or ibuprofen (Advil) as needed. 2. Take your usually prescribed medications unless otherwise directed 3. If you need a refill on your pain medication, please contact your pharmacy.  They will contact our office to request authorization.  Prescriptions will not be filled after 5pm or on week-ends. 4. You should eat very light the first 24 hours after surgery, such as soup, crackers, pudding, etc.  Resume your normal diet the day after surgery. 5. Most patients will experience some swelling and bruising in the breast.  Ice packs and a good support bra will help.  Swelling and bruising can take several days to resolve.  6. It is common to experience some constipation if taking pain medication after surgery.  Increasing fluid intake and taking a stool softener will usually help or prevent this problem from occurring.  A mild laxative (Milk of Magnesia or Miralax) should be taken according to package directions if there are no bowel movements after 48 hours. 7. Unless discharge instructions indicate otherwise, you may remove your bandages 48 hours after surgery, and you may shower at that time.  You may have steri-strips (small skin tapes) in place directly over the incision.  These strips should be left on the skin .  If your surgeon used skin glue on the incision, you may shower in 24 hours.  The glue will flake off over the next 2-3 weeks.   Any sutures or staples will be removed at the office during your follow-up visit. 8. ACTIVITIES:  You may resume regular daily activities (gradually increasing) beginning the next day.  Wearing a good support bra or sports bra minimizes pain and swelling.  You may have sexual intercourse when it is comfortable. a. You may drive when you no longer are taking prescription pain medication, you can comfortably wear a seatbelt, and you can safely maneuver your car and apply brakes. b. RETURN TO WORK:  ______________________________________________________________________________________ 9. You should see your doctor in the office for a follow-up appointment approximately two weeks after your surgery.  Please call and make that appointment if you do not all ready have one.  Expect your pathology report 2-3 business days after your surgery.  You may call to check if you do not hear from us after three days. 10. OTHER INSTRUCTIONS: _______________________________________________________________________________________________ _____________________________________________________________________________________________________________________________________ _____________________________________________________________________________________________________________________________________ _____________________________________________________________________________________________________________________________________  WHEN TO CALL YOUR DOCTOR: 1. Fever over 101.0 2. Nausea and/or vomiting. 3. Extreme swelling or bruising. 4. Continued bleeding from incision. 5. Increased pain, redness, or drainage from the incision.  The clinic staff is available to answer your questions during regular business hours.  Please dont hesitate to call and ask to speak to one of the nurses for clinical concerns.  If you have a medical emergency, go to the nearest emergency room or call 911.  A surgeon from [facility] is always on call at the hospital.  For further questions, please visit [email or web address]  And a

## 2013-09-13 NOTE — Anesthesia Procedure Notes (Addendum)
Performed by: Renda RollsFURLOW, DIANE EICHMAN    Procedure Name: LMA Insertion Date/Time: 09/13/2013 12:51 PM Performed by: Ellin GoodieWEAVER, Martena Emanuele M Pre-anesthesia Checklist: Patient identified, Emergency Drugs available, Suction available, Patient being monitored and Timeout performed Patient Re-evaluated:Patient Re-evaluated prior to inductionOxygen Delivery Method: Circle system utilized Preoxygenation: Pre-oxygenation with 100% oxygen Intubation Type: IV induction Ventilation: Mask ventilation without difficulty LMA: LMA inserted LMA Size: 4.0 Number of attempts: 1 Placement Confirmation: breath sounds checked- equal and bilateral and positive ETCO2 Tube secured with: Tape Dental Injury: Teeth and Oropharynx as per pre-operative assessment  Comments: Easy induction and LMA insertion.  Dr. Katrinka BlazingSmith verified placement.  Carlynn HeraldH Weaver, CRNA

## 2013-09-13 NOTE — Anesthesia Postprocedure Evaluation (Signed)
  Anesthesia Post-op Note  Patient: Shannon Palmer  Procedure(s) Performed: Procedure(s): BREAST LUMPECTOMY WITH NEEDLE LOCALIZATION (Right)  Patient Location: PACU  Anesthesia Type:General  Level of Consciousness: awake, alert , oriented, sedated and patient cooperative  Airway and Oxygen Therapy: Patient Spontanous Breathing  Post-op Pain: mild  Post-op Assessment: Post-op Vital signs reviewed, Patient's Cardiovascular Status Stable, Respiratory Function Stable, Patent Airway, No signs of Nausea or vomiting and Pain level controlled  Post-op Vital Signs: stable  Last Vitals:  Filed Vitals:   09/13/13 1345  BP:   Pulse: 85  Temp:   Resp: 13    Complications: No apparent anesthesia complications

## 2013-09-14 ENCOUNTER — Telehealth (INDEPENDENT_AMBULATORY_CARE_PROVIDER_SITE_OTHER): Payer: Self-pay

## 2013-09-14 NOTE — Telephone Encounter (Signed)
Calling pt to see how she was feeling post sx. Pt states that she has been doing great. Reminded pt of po appt. Informed pt that if she needs anything before then to give us a call. Pt verbalized understanding.

## 2013-09-15 ENCOUNTER — Encounter (HOSPITAL_COMMUNITY): Payer: Self-pay | Admitting: General Surgery

## 2013-09-19 ENCOUNTER — Telehealth (INDEPENDENT_AMBULATORY_CARE_PROVIDER_SITE_OTHER): Payer: Self-pay | Admitting: *Deleted

## 2013-09-19 NOTE — Telephone Encounter (Signed)
Pt called for her pathology report.  I advised her that Dr. Abbey Chattersosenbower stated that it showed fibrocystic changes but no evidence of malignancy.  Pt was very appreciative and said she will see him at her f/u appt 8.3.15.  Victorino DikeJennifer

## 2013-09-20 ENCOUNTER — Telehealth (INDEPENDENT_AMBULATORY_CARE_PROVIDER_SITE_OTHER): Payer: Self-pay

## 2013-09-20 NOTE — Telephone Encounter (Signed)
Pt made aware pathology showed fibrocystic changes.  No malignancy.

## 2013-09-26 ENCOUNTER — Ambulatory Visit (INDEPENDENT_AMBULATORY_CARE_PROVIDER_SITE_OTHER): Payer: Medicare Other | Admitting: General Surgery

## 2013-09-26 ENCOUNTER — Encounter (INDEPENDENT_AMBULATORY_CARE_PROVIDER_SITE_OTHER): Payer: Self-pay | Admitting: General Surgery

## 2013-09-26 VITALS — BP 110/80 | HR 80 | Temp 98.2°F | Resp 20 | Ht 62.5 in | Wt 168.4 lb

## 2013-09-26 DIAGNOSIS — Z4889 Encounter for other specified surgical aftercare: Secondary | ICD-10-CM

## 2013-09-26 NOTE — Patient Instructions (Signed)
May use scar cream or Vitamin E.

## 2013-09-26 NOTE — Progress Notes (Signed)
Procedure:  Right breast lumpectomy after needle localization  Date:  09/13/2013  Pathology:  Fibrocystic changes with no atypia. No malignancy.  History:  She is here for her first postoperative visit. She is aware of her benign pathology. She is doing well.  Exam: General- Is in NAD. Right breast-superior incision is clean and intact.  Assessment:  Status post right breast lumpectomy for atypical lobular hyperplasia-no evidence of malignancy or other atypia.  Plan:  Annual mammogram and breast exam recommended. Return visit prn.

## 2013-09-29 ENCOUNTER — Encounter (INDEPENDENT_AMBULATORY_CARE_PROVIDER_SITE_OTHER): Payer: Self-pay

## 2014-12-21 IMAGING — CR DG CHEST 2V
2 series · 2 of 2 positions shown · non-contrast
Comparison: 03/01/2009

CLINICAL DATA: Preop for breast lumpectomy

EXAM:
CHEST  2 VIEW

[w chest pa]
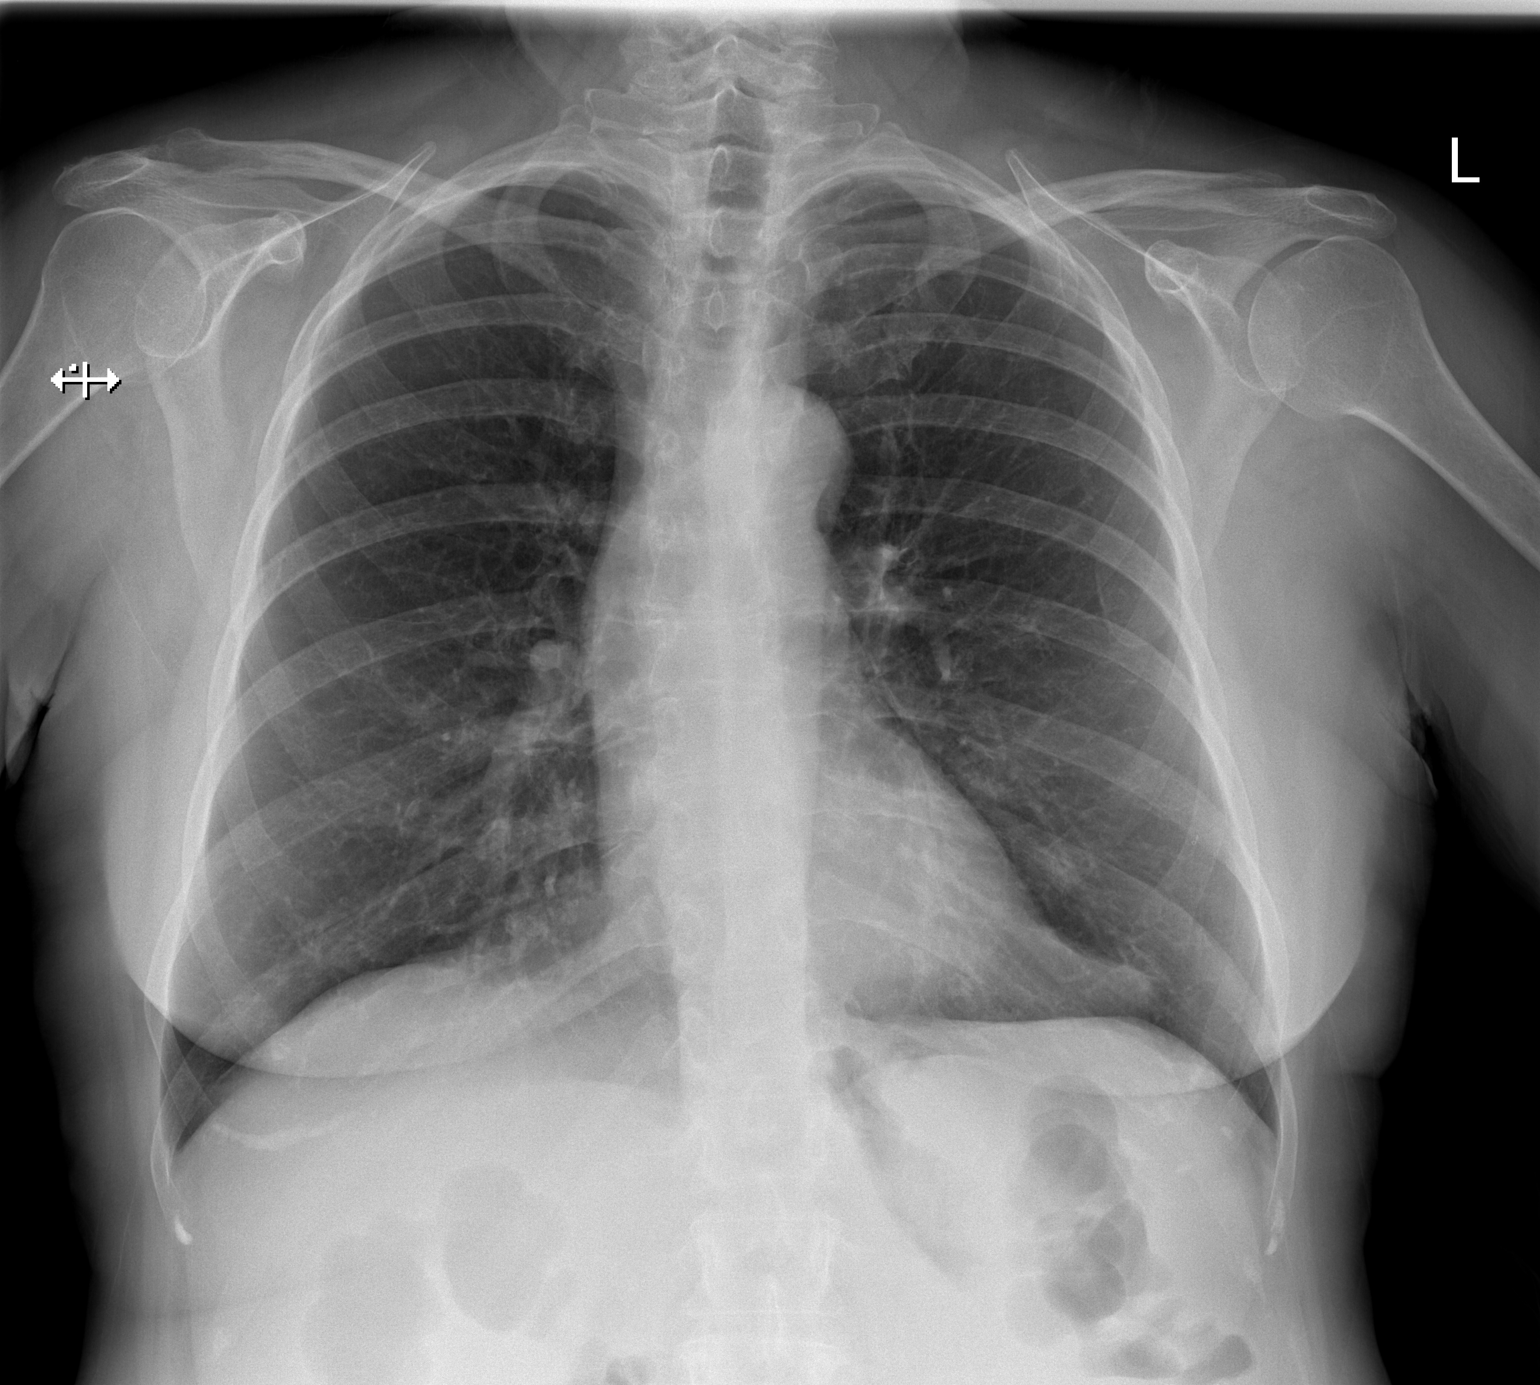

[w chest lat]
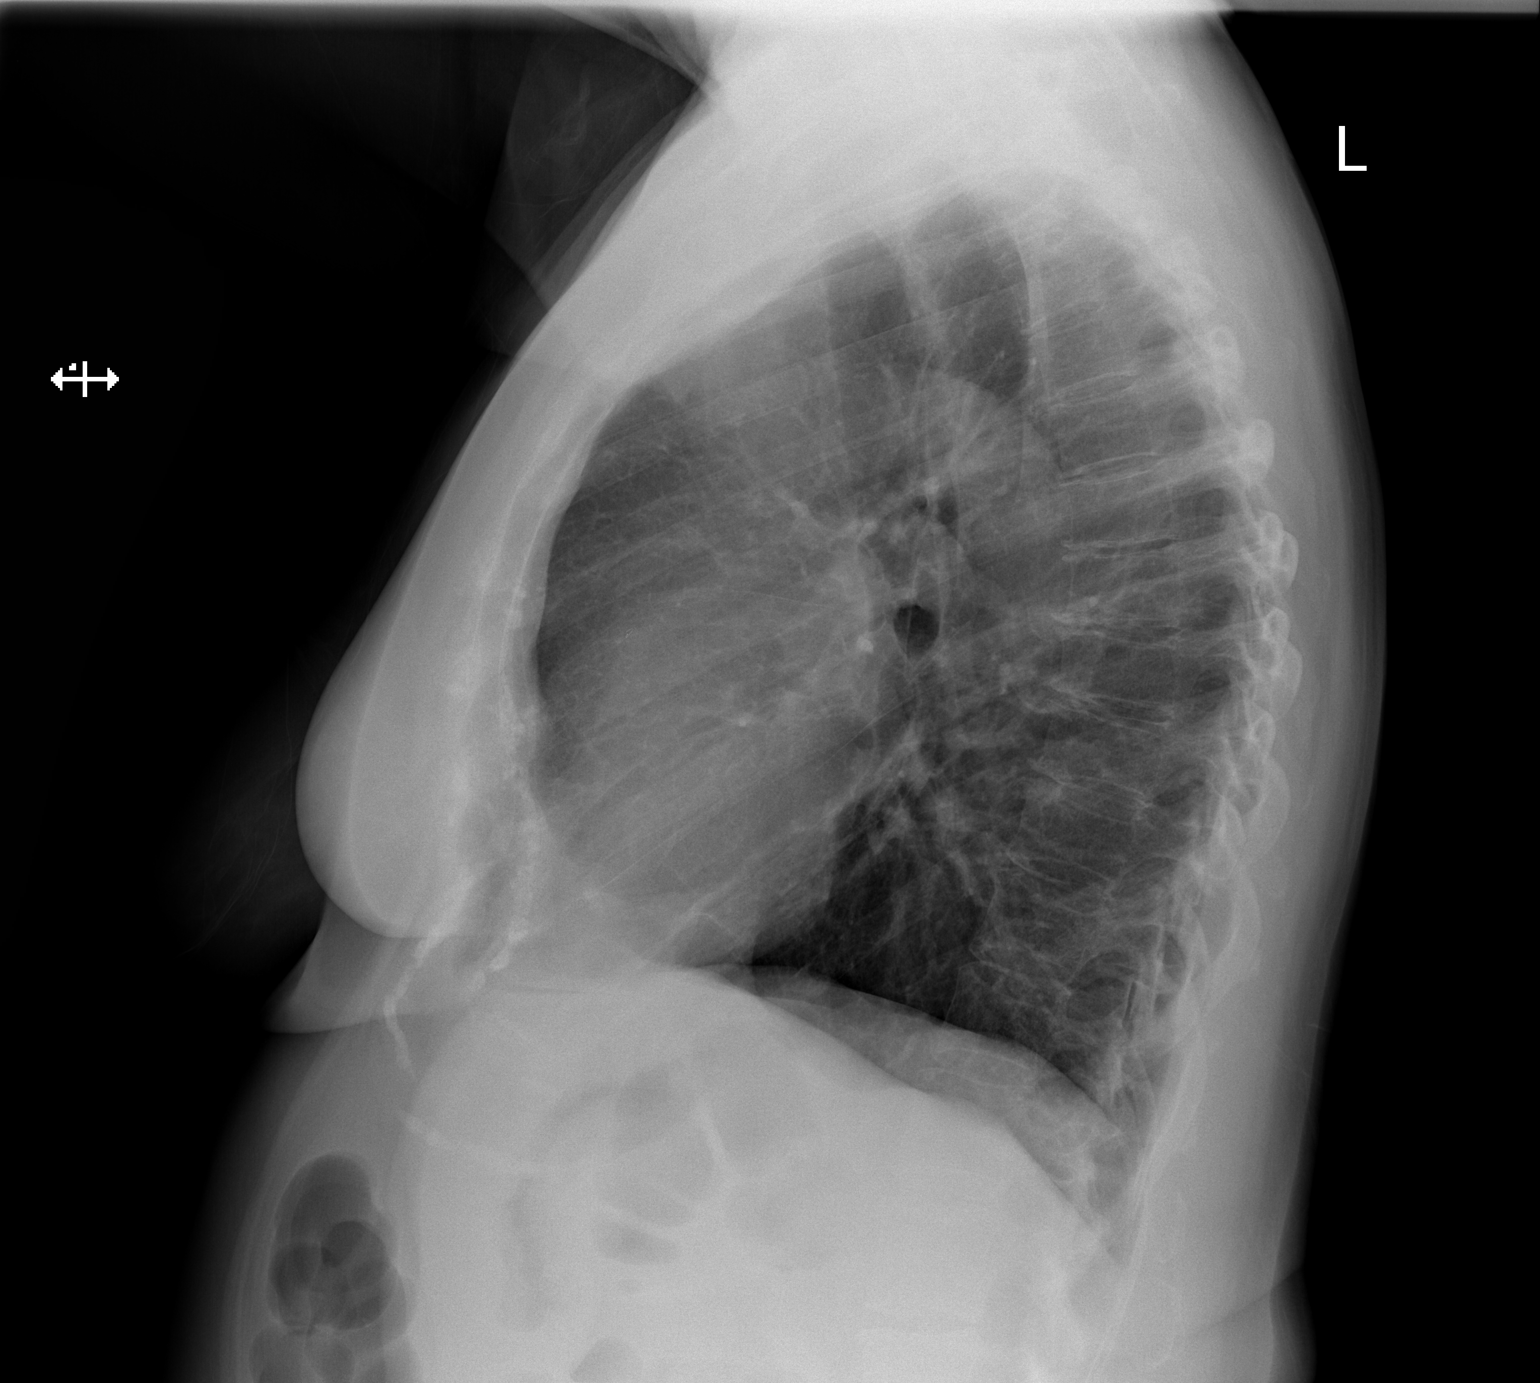

[2 of 2 positions shown; findings below may reference images not displayed]

FINDINGS: Cardiomediastinal silhouette is stable. No acute infiltrate or
pleural effusion. No pulmonary edema. Mild thoracic dextroscoliosis.
IMPRESSION: No active cardiopulmonary disease.

## 2019-03-26 ENCOUNTER — Ambulatory Visit: Payer: Self-pay

## 2019-04-01 ENCOUNTER — Ambulatory Visit: Payer: Medicare PPO | Attending: Internal Medicine

## 2019-04-01 DIAGNOSIS — Z23 Encounter for immunization: Secondary | ICD-10-CM

## 2019-04-01 NOTE — Progress Notes (Signed)
   Covid-19 Vaccination Clinic  Name:  KAYRON KALMAR    MRN: [id number] DOB: 01/26/1946  04/01/2019  Ms. Denise was observed post Covid-19 immunization for 15 minutes without incidence. She was provided with Vaccine Information Sheet and instruction to access the V-Safe system.   Ms. Vukelich was instructed to call 911 with any severe reactions post vaccine: Marland Kitchen Difficulty breathing  . Swelling of your face and throat  . A fast heartbeat  . A bad rash all over your body  . Dizziness and weakness    Immunizations Administered    Name Date Dose VIS Date Route   Pfizer COVID-19 Vaccine 04/01/2019  8:26 AM 0.3 mL 02/04/2019 Intramuscular   Manufacturer: [employer], [employer]   Lot: [id number]   NDC: [address]-3

## 2019-04-06 ENCOUNTER — Ambulatory Visit: Payer: Self-pay

## 2019-04-26 ENCOUNTER — Ambulatory Visit: Payer: Medicare PPO | Attending: Internal Medicine

## 2019-04-26 DIAGNOSIS — Z23 Encounter for immunization: Secondary | ICD-10-CM | POA: Insufficient documentation

## 2019-04-26 NOTE — Progress Notes (Signed)
   Covid-19 Vaccination Clinic  Name:  Shannon Palmer    MRN: [id number] DOB: 1945/09/12  04/26/2019  [name] was observed post Covid-19 immunization for 15 minutes without incident. She was provided with Vaccine Information Sheet and instruction to access the V-Safe system.   [name] was instructed to call 911 with any severe reactions post vaccine: Marland Kitchen Difficulty breathing  . Swelling of face and throat  . A fast heartbeat  . A bad rash all over body  . Dizziness and weakness   Immunizations Administered    Name Date Dose VIS Date Route   Pfizer COVID-19 Vaccine 04/26/2019  9:02 AM 0.3 mL 02/04/2019 Intramuscular   Manufacturer: [employer], [employer]   Lot: [id number]   NDC: [address]-8

## 2023-11-22 ENCOUNTER — Other Ambulatory Visit: Payer: Self-pay

## 2023-11-22 ENCOUNTER — Encounter (HOSPITAL_BASED_OUTPATIENT_CLINIC_OR_DEPARTMENT_OTHER): Payer: Self-pay

## 2023-11-22 ENCOUNTER — Emergency Department (HOSPITAL_BASED_OUTPATIENT_CLINIC_OR_DEPARTMENT_OTHER)

## 2023-11-22 ENCOUNTER — Emergency Department (HOSPITAL_BASED_OUTPATIENT_CLINIC_OR_DEPARTMENT_OTHER)
Admission: EM | Admit: 2023-11-22 | Discharge: 2023-11-22 | Disposition: A | Discharge status: Home / Self Care | Attending: Emergency Medicine | Admitting: Emergency Medicine

## 2023-11-22 DIAGNOSIS — Z79899 Other long term (current) drug therapy: Secondary | ICD-10-CM | POA: Insufficient documentation

## 2023-11-22 DIAGNOSIS — I10 Essential (primary) hypertension: Secondary | ICD-10-CM | POA: Diagnosis not present

## 2023-11-22 DIAGNOSIS — M7989 Other specified soft tissue disorders: Secondary | ICD-10-CM | POA: Insufficient documentation

## 2023-11-22 DIAGNOSIS — Z7982 Long term (current) use of aspirin: Secondary | ICD-10-CM | POA: Diagnosis not present

## 2023-11-22 MED ORDER — METHYLPREDNISOLONE 4 MG PO TBPK: ORAL_TABLET | ORAL | 0 refills | Status: AC

## 2023-11-22 MED ORDER — PREDNISONE 50 MG PO TABS
60.0000 mg | ORAL_TABLET | Freq: Once | ORAL | Status: AC
  Administered 2023-11-22: 60 mg via ORAL
  Filled 2023-11-22: qty 1

## 2023-11-22 NOTE — ED Triage Notes (Signed)
 Reports R ring finger worsening swelling, warmth, redness and pain for 1.5 weeks. Has appt w ortho this week. PCP states it could be gout.   Denies loss of sensation or mobility.   EDP in triage

## 2023-11-22 NOTE — Discharge Instructions (Addendum)
 Follow-up with hand doctor.  Dr. Sebastian will call you tomorrow to schedule follow-up on Tuesday if you are unable to follow-up with your hand doctor tomorrow.  Continue steroids starting tomorrow.  You have been given your first dose.  Return if you develop fever worsening pain and swelling.

## 2023-11-22 NOTE — ED Provider Notes (Signed)
 [facility] Provider Note   CSN: [id number] Arrival date & time: 11/22/23  1615     Patient presents with: Joint Swelling (finger)   Shannon Palmer is a 78 y.o. female.   Patient here with ongoing swelling to her right middle finger.  Swelling is on the back of the right middle finger she is post to see a hand doctor this week.  She was just treated with a course of steroids for suspected gout.  She not having any major pain.  Swelling got better and then got a little bit worse again.  Is not having any pain relief flexing and extending it.  She has had gout in her feet before but never in her hand.  Denies any trauma.  She has a history of hypertension high cholesterol IBS reflux.  Denies any fever or chills.  Denies any trauma.  No history of rheumatoid arthritis.  The history is provided by the patient.       Prior to Admission medications   Medication Sig Start Date End Date Taking? Authorizing Provider  methylPREDNISolone (MEDROL DOSEPAK) 4 MG TBPK tablet Follow package insert 11/22/23  Yes Shaunte Tuft, DO  albuterol (PROVENTIL HFA;VENTOLIN HFA) 108 (90 BASE) MCG/ACT inhaler Inhale 1-2 puffs into the lungs every 6 (six) hours as needed for wheezing or shortness of breath.    [provider]  aspirin 81 MG tablet Take 81 mg by mouth daily.    [provider]  beta carotene w/minerals (OCUVITE) tablet Take 1 tablet by mouth daily.    [provider]  Cholecalciferol (VITAMIN D) 2000 UNITS tablet Take 2,000 Units by mouth daily.    [provider]  Coenzyme Q10 (CO Q 10 PO) Take 1 capsule by mouth daily.    [provider]  cyclobenzaprine (FLEXERIL) 10 MG tablet Take 10 mg by mouth 3 (three) times daily as needed for muscle spasms.    [provider]  ibuprofen (ADVIL,MOTRIN) 800 MG tablet Take 800 mg by mouth every 8 (eight) hours as needed for moderate pain.     [provider]  lisinopril-hydrochlorothiazide (PRINZIDE,ZESTORETIC) 20-12.5 MG per tablet Take 1 tablet by mouth daily.    [provider]  meclizine (ANTIVERT) 25 MG tablet Take 25 mg by mouth 3 (three) times daily as needed for dizziness.    [provider]  Omega-3 Fatty Acids (FISH OIL PO) Take 1 capsule by mouth daily.     [provider]  omeprazole (PRILOSEC) 20 MG capsule 20 mg daily.    [provider]  rosuvastatin (CRESTOR) 20 MG tablet Take 20 mg by mouth daily.    [provider]    Allergies: Ambien [zolpidem], Calendula officinalis (marigo, and Ciprofloxacin    Review of Systems  Updated Vital Signs BP (!) 192/90 (BP Location: Right Arm)   Pulse (!) 115   Temp 98.4 F (36.9 C) (Oral)   Resp (!) 28   SpO2 99%   Physical Exam Vitals and nursing note reviewed.  Constitutional:      General: She is not in acute distress.    Appearance: She is well-developed.  HENT:     Head: Normocephalic and atraumatic.     Mouth/Throat:     Mouth: Mucous membranes are moist.  Eyes:     Extraocular Movements: Extraocular movements intact.     Conjunctiva/sclera: Conjunctivae normal.     Pupils: Pupils are equal, round, and reactive to  light.  Cardiovascular:     Rate and Rhythm: Normal rate and regular rhythm.     Pulses: Normal pulses.     Heart sounds: Normal heart sounds. No murmur heard. Pulmonary:     Effort: Pulmonary effort is normal. No respiratory distress.     Breath sounds: Normal breath sounds.  Abdominal:     Palpations: Abdomen is soft.     Tenderness: There is no abdominal tenderness.  Musculoskeletal:        General: Swelling present.     Cervical back: Neck supple.     Comments: Swelling to the back of the right middle finger over the middle knuckle, pictures in the media file, there is no major redness tenderness or warmth to this area seems a little cystic/may be mildly fluctuant but she can flex and extend  without any major discomfort, there is no swelling over the flexor portion of the finger  Skin:    General: Skin is warm and dry.     Capillary Refill: Capillary refill takes less than 2 seconds.  Neurological:     Mental Status: She is alert.  Psychiatric:        Mood and Affect: Mood normal.     (all labs ordered are listed, but only abnormal results are displayed) Labs Reviewed - No data to display  EKG: None  Radiology: DG Finger Middle Right Result Date: 11/22/2023 CLINICAL DATA:  Pain, history of arthritis and gout EXAM: RIGHT MIDDLE FINGER 2+V COMPARISON:  None Available. FINDINGS: Frontal, oblique, and lateral views of the right third digit are obtained. There is diffuse soft tissue swelling, greatest proximally. No subcutaneous gas, radiopaque foreign body, or soft tissue calcifications. There are no acute displaced fractures. There is diffuse joint space narrowing, greatest in the proximal interphalangeal joint. Marginal erosions are seen involving the head of the third proximal phalanx. No periosteal reaction. IMPRESSION: 1. No acute displaced fracture. 2. Marked soft tissue swelling, with marginal erosions involving the head of the third proximal phalanx, compatible with given history of gout. Superimposed infection and osteomyelitis would be difficult to exclude, and correlation with clinical presentation and laboratory analysis is recommended. 3. 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 Electronically Signed   By: Ozell Daring M.D.   On: 11/22/2023 17:47     Procedures   Medications Ordered in the ED  predniSONE (DELTASONE) tablet 60 mg (has no administration in time range)                                    Medical Decision Making Amount and/or Complexity of Data Reviewed Radiology: ordered.  Risk Prescription drug management.   Shannon Palmer is here with ongoing swelling of her left middle finger.  History of arthritis history of  gout.  She has been treated with steroids here the last week for suspected gout in the finger.  Supposed to follow-up with hand later this week.  Has any fever or chills.  She has history of hypertension.  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  This looks like an inflammatory process in the back of her left middle finger knuckle.  There is no major redness erythema warmth.  She got good range of motion without any major discomfort.  Is no swelling on the flexor side of the finger.  X-ray showed gout changes at  [facility] a few days ago.  Today x-ray continues to show likely gout/inflammatory process.  I have clinically low suspicion for infection or osteomyelitis given her exam.  I talked with Dr. Sebastian with hand on the phone who reviewed the images of her finger that I put in the chart as well as her x-ray.  He is in agreement that this is likely an inflammatory process as well.  Will do another round of steroids.  He will try to follow-up with her on Tuesday if she can follow-up with her hand doctor tomorrow.  At this time we do not think there is any need for antibiotics or surgical process.  She is neurovascular neuromuscular intact.  She understands return precautions.  Patient discharged.  This chart was dictated using voice recognition software.  Despite best efforts to proofread,  errors can occur which can change the documentation meaning.      Final diagnoses:  Finger swelling    ED Discharge Orders          Ordered    methylPREDNISolone (MEDROL DOSEPAK) 4 MG TBPK tablet        11/22/23 1806               Ruthe Cornet, DO 11/22/23 1808

## 2023-11-23 NOTE — ED Notes (Signed)
 [facility] called to verify approval of Methylprednisolone as Patient had Prednisone prescribe on 09/23 bu another physician.  Per ER Physian (S).  Ok to fill.
